# Patient Record
Sex: Male | Born: 1975 | Race: White | Hispanic: No | Marital: Single | Smoking: Current every day smoker
Health system: Southern US, Community
[De-identification: ages and names within clinical notes are randomized; demographics above are authoritative.]

---

## 2009-03-11 ENCOUNTER — Other Ambulatory Visit: Admission: RE | Admit: 2009-03-11 | Discharge: 2009-03-11 | Payer: Self-pay | Admitting: Urology

## 2013-08-19 ENCOUNTER — Encounter: Payer: Self-pay | Admitting: Physician Assistant

## 2013-08-19 ENCOUNTER — Ambulatory Visit (INDEPENDENT_AMBULATORY_CARE_PROVIDER_SITE_OTHER): Payer: BC Managed Care – PPO | Admitting: Physician Assistant

## 2013-08-19 VITALS — BP 118/80 | HR 74 | Temp 98.3°F | Ht 74.5 in | Wt 183.0 lb

## 2013-08-19 DIAGNOSIS — Z23 Encounter for immunization: Secondary | ICD-10-CM

## 2013-08-19 DIAGNOSIS — Z0000 Encounter for general adult medical examination without abnormal findings: Secondary | ICD-10-CM

## 2013-08-19 LAB — POCT URINALYSIS DIPSTICK
Bilirubin, UA: NEGATIVE
Blood, UA: NEGATIVE
Glucose, UA: NEGATIVE
Ketones, UA: NEGATIVE
LEUKOCYTES UA: NEGATIVE
NITRITE UA: NEGATIVE
PH UA: 5
PROTEIN UA: NEGATIVE
Spec Grav, UA: 1.01
UROBILINOGEN UA: NEGATIVE

## 2013-08-19 LAB — POCT UA - MICROSCOPIC ONLY
Bacteria, U Microscopic: NEGATIVE
CASTS, UR, LPF, POC: NEGATIVE
CRYSTALS, UR, HPF, POC: NEGATIVE
EPITHELIAL CELLS, URINE PER MICROSCOPY: NEGATIVE
Mucus, UA: NEGATIVE
RBC, URINE, MICROSCOPIC: NEGATIVE
WBC, Ur, HPF, POC: NEGATIVE
YEAST UA: NEGATIVE

## 2013-08-19 NOTE — Progress Notes (Signed)
Subjective:     Patient ID: Daleen SquibbKevin Lares, male   DOB: February 09, 1976, 38 y.o.   MRN: [id number]  HPI Pt needing PE for his ins at work Only complaint is some sl discharge when he has an erection  Review of Systems  HENT: Negative.   Eyes: Negative.   Respiratory: Negative.   Cardiovascular: Negative.   Gastrointestinal: Negative.   Endocrine: Negative.   Genitourinary: Positive for discharge. Negative for dysuria, urgency, frequency, hematuria, flank pain, decreased urine volume, penile swelling, scrotal swelling, enuresis, difficulty urinating, genital sores, penile pain and testicular pain.  Musculoskeletal: Negative.   Skin: Negative.   Allergic/Immunologic: Negative.   Neurological: Positive for weakness.  Psychiatric/Behavioral: Negative.        Objective:   Physical Exam  Nursing note and vitals reviewed. Constitutional: He is oriented to person, place, and time. He appears well-developed and well-nourished.  HENT:  Head: Normocephalic and atraumatic.  Right Ear: External ear normal.  Left Ear: External ear normal.  Nose: Nose normal.  Mouth/Throat: Oropharynx is clear and moist.  Eyes: Conjunctivae and EOM are normal. Pupils are equal, round, and reactive to light.  Neck: Normal range of motion. Neck supple. No JVD present. No tracheal deviation present. No thyromegaly present.  Cardiovascular: Normal rate, regular rhythm, normal heart sounds and intact distal pulses.   Pulmonary/Chest: Effort normal and breath sounds normal. He has no wheezes.  Abdominal: Soft. Bowel sounds are normal. He exhibits no distension and no mass. There is no tenderness. There is no rebound and no guarding.  Genitourinary: Penis normal. No penile tenderness.  No ing nodes Testes down No hernia  Musculoskeletal: Normal range of motion. He exhibits no edema.  Lymphadenopathy:    He has no cervical adenopathy.  Neurological: He is alert and oriented to person, place, and time. He has normal  reflexes.  Skin: Skin is warm and dry. No rash noted. No erythema. No pallor.  Psychiatric: He has a normal mood and affect. His behavior is normal. Judgment and thought content normal.       Assessment:     Physical Exam    Plan:     Pt needing dental and exam Full labs done today- will inform of lab results Cont with his water intake Hgt/wgt approp Cont with regular exercise Diet needs to improve so reviewed this with him today Smoking cessation reviewed F/U pending labs

## 2013-08-19 NOTE — Patient Instructions (Signed)
Fat and Cholesterol Control Diet  Fat and cholesterol levels in your blood and organs are influenced by your diet. High levels of fat and cholesterol may lead to diseases of the heart, small and large blood vessels, gallbladder, liver, and pancreas.  CONTROLLING FAT AND CHOLESTEROL WITH DIET  Although exercise and lifestyle factors are important, your diet is key. That is because certain foods are known to raise cholesterol and others to lower it. The goal is to balance foods for their effect on cholesterol and more importantly, to replace saturated and trans fat with other types of fat, such as monounsaturated fat, polyunsaturated fat, and omega-3 fatty acids.  On average, a person should consume no more than 15 to 17 g of saturated fat daily. Saturated and trans fats are considered "bad" fats, and they will raise LDL cholesterol. Saturated fats are primarily found in animal products such as meats, butter, and cream. However, that does not mean you need to give up all your favorite foods. Today, there are good tasting, low-fat, low-cholesterol substitutes for most of the things you like to eat. Choose low-fat or nonfat alternatives. Choose round or loin cuts of red meat. These types of cuts are lowest in fat and cholesterol. Chicken (without the skin), fish, veal, and ground turkey breast are great choices. Eliminate fatty meats, such as hot dogs and salami. Even shellfish have little or no saturated fat. Have a 3 oz (85 g) portion when you eat lean meat, poultry, or fish.  Trans fats are also called "partially hydrogenated oils." They are oils that have been scientifically manipulated so that they are solid at room temperature resulting in a longer shelf life and improved taste and texture of foods in which they are added. Trans fats are found in stick margarine, some tub margarines, cookies, crackers, and baked goods.   When baking and cooking, oils are a great substitute for butter. The monounsaturated oils are  especially beneficial since it is believed they lower LDL and raise HDL. The oils you should avoid entirely are saturated tropical oils, such as coconut and palm.   Remember to eat a lot from food groups that are naturally free of saturated and trans fat, including fish, fruit, vegetables, beans, grains (barley, rice, couscous, bulgur wheat), and pasta (without cream sauces).   IDENTIFYING FOODS THAT LOWER FAT AND CHOLESTEROL   Soluble fiber may lower your cholesterol. This type of fiber is found in fruits such as apples, vegetables such as broccoli, potatoes, and carrots, legumes such as beans, peas, and lentils, and grains such as barley. Foods fortified with plant sterols (phytosterol) may also lower cholesterol. You should eat at least 2 g per day of these foods for a cholesterol lowering effect.   Read package labels to identify low-saturated fats, trans fat free, and low-fat foods at the supermarket. Select cheeses that have only 2 to 3 g saturated fat per ounce. Use a heart-healthy tub margarine that is free of trans fats or partially hydrogenated oil. When buying baked goods (cookies, crackers), avoid partially hydrogenated oils. Breads and muffins should be made from whole grains (whole-wheat or whole oat flour, instead of "flour" or "enriched flour"). Buy non-creamy canned soups with reduced salt and no added fats.   FOOD PREPARATION TECHNIQUES   Never deep-fry. If you must fry, either stir-fry, which uses very little fat, or use non-stick cooking sprays. When possible, broil, bake, or roast meats, and steam vegetables. Instead of putting butter or margarine on vegetables, use lemon   and herbs, applesauce, and cinnamon (for squash and sweet potatoes). Use nonfat yogurt, salsa, and low-fat dressings for salads.   LOW-SATURATED FAT / LOW-FAT FOOD SUBSTITUTES  Meats / Saturated Fat (g)  · Avoid: Steak, marbled (3 oz/85 g) / 11 g  · Choose: Steak, lean (3 oz/85 g) / 4 g  · Avoid: Hamburger (3 oz/85 g) / 7  g  · Choose: Hamburger, lean (3 oz/85 g) / 5 g  · Avoid: Ham (3 oz/85 g) / 6 g  · Choose: Ham, lean cut (3 oz/85 g) / 2.4 g  · Avoid: Chicken, with skin, dark meat (3 oz/85 g) / 4 g  · Choose: Chicken, skin removed, dark meat (3 oz/85 g) / 2 g  · Avoid: Chicken, with skin, light meat (3 oz/85 g) / 2.5 g  · Choose: Chicken, skin removed, light meat (3 oz/85 g) / 1 g  Dairy / Saturated Fat (g)  · Avoid: Whole milk (1 cup) / 5 g  · Choose: Low-fat milk, 2% (1 cup) / 3 g  · Choose: Low-fat milk, 1% (1 cup) / 1.5 g  · Choose: Skim milk (1 cup) / 0.3 g  · Avoid: Hard cheese (1 oz/28 g) / 6 g  · Choose: Skim milk cheese (1 oz/28 g) / 2 to 3 g  · Avoid: Cottage cheese, 4% fat (1 cup) / 6.5 g  · Choose: Low-fat cottage cheese, 1% fat (1 cup) / 1.5 g  · Avoid: Ice cream (1 cup) / 9 g  · Choose: Sherbet (1 cup) / 2.5 g  · Choose: Nonfat frozen yogurt (1 cup) / 0.3 g  · Choose: Frozen fruit bar / trace  · Avoid: Whipped cream (1 tbs) / 3.5 g  · Choose: Nondairy whipped topping (1 tbs) / 1 g  Condiments / Saturated Fat (g)  · Avoid: Mayonnaise (1 tbs) / 2 g  · Choose: Low-fat mayonnaise (1 tbs) / 1 g  · Avoid: Butter (1 tbs) / 7 g  · Choose: Extra light margarine (1 tbs) / 1 g  · Avoid: Coconut oil (1 tbs) / 11.8 g  · Choose: Olive oil (1 tbs) / 1.8 g  · Choose: Corn oil (1 tbs) / 1.7 g  · Choose: Safflower oil (1 tbs) / 1.2 g  · Choose: Sunflower oil (1 tbs) / 1.4 g  · Choose: Soybean oil (1 tbs) / 2.4 g  · Choose: Canola oil (1 tbs) / 1 g  Document Released: 02/13/2005 Document Revised: 06/10/2012 Document Reviewed: 08/04/2010  [employer]® Patient Information ©2015 [employer]. This information is not intended to replace advice given to you by your health care Elhadji Pecore. Make sure you discuss any questions you have with your health care Fahed Morten.

## 2013-08-20 LAB — CMP14+EGFR
ALBUMIN: 4.6 g/dL (ref 3.5–5.5)
ALT: 45 IU/L — ABNORMAL HIGH (ref 0–44)
AST: 34 IU/L (ref 0–40)
Albumin/Globulin Ratio: 1.9 (ref 1.1–2.5)
Alkaline Phosphatase: 72 IU/L (ref 39–117)
BILIRUBIN TOTAL: 0.3 mg/dL (ref 0.0–1.2)
BUN / CREAT RATIO: 11 (ref 8–19)
BUN: 12 mg/dL (ref 6–20)
CHLORIDE: 101 mmol/L (ref 97–108)
CO2: 21 mmol/L (ref 18–29)
CREATININE: 1.07 mg/dL (ref 0.76–1.27)
Calcium: 9.7 mg/dL (ref 8.7–10.2)
GFR, EST AFRICAN AMERICAN: 103 mL/min/{1.73_m2} (ref >59)
GFR, EST NON AFRICAN AMERICAN: 89 mL/min/{1.73_m2} (ref >59)
GLOBULIN, TOTAL: 2.4 g/dL (ref 1.5–4.5)
GLUCOSE: 94 mg/dL (ref 65–99)
Potassium: 4 mmol/L (ref 3.5–5.2)
Sodium: 139 mmol/L (ref 134–144)
TOTAL PROTEIN: 7 g/dL (ref 6.0–8.5)

## 2013-08-20 LAB — LIPID PANEL
CHOLESTEROL TOTAL: 154 mg/dL (ref 100–199)
Chol/HDL Ratio: 2.2 ratio units (ref 0.0–5.0)
HDL: 70 mg/dL (ref >39)
LDL Calculated: 71 mg/dL (ref 0–99)
Triglycerides: 66 mg/dL (ref 0–149)
VLDL CHOLESTEROL CAL: 13 mg/dL (ref 5–40)

## 2013-08-22 LAB — GC/CHLAMYDIA PROBE AMP
CHLAMYDIA, DNA PROBE: NEGATIVE
NEISSERIA GONORRHOEAE BY PCR: NEGATIVE

## 2013-08-25 ENCOUNTER — Telehealth: Payer: Self-pay | Admitting: Family Medicine

## 2013-08-25 NOTE — Telephone Encounter (Signed)
Message copied by Azalee CourseFULP, ASHLEY on Mon Aug 25, 2013 11:10 AM ------      Message from: Inis SizerWEBSTER, WILLIAM L      Created: Fri Aug 22, 2013  4:20 PM       Pls inform all labs ok ------

## 2013-08-26 NOTE — Telephone Encounter (Signed)
Pt aware labs ok 

## 2013-09-03 ENCOUNTER — Telehealth: Payer: Self-pay | Admitting: Physician Assistant

## 2013-09-03 NOTE — Telephone Encounter (Signed)
Patient aware.

## 2013-09-10 ENCOUNTER — Encounter: Payer: Self-pay | Admitting: Physician Assistant

## 2014-06-09 ENCOUNTER — Telehealth: Payer: Self-pay | Admitting: Family Medicine

## 2014-06-09 NOTE — Telephone Encounter (Signed)
Pt given appt with Lynwood Dawleyiffany Gann 4/21 at 8:30. Pt aware it hasn't been a full year since his last physical but says his work is requiring him to have the physical and not just have the form filled out.

## 2014-06-18 ENCOUNTER — Encounter: Payer: Self-pay | Admitting: Physician Assistant

## 2014-06-18 ENCOUNTER — Ambulatory Visit (INDEPENDENT_AMBULATORY_CARE_PROVIDER_SITE_OTHER): Payer: BLUE CROSS/BLUE SHIELD | Admitting: Physician Assistant

## 2014-06-18 VITALS — BP 130/87 | HR 72 | Temp 97.0°F | Ht 74.0 in | Wt 181.4 lb

## 2014-06-18 DIAGNOSIS — Z0000 Encounter for general adult medical examination without abnormal findings: Secondary | ICD-10-CM

## 2014-06-18 LAB — POCT CBC
Granulocyte percent: 50.5 %G (ref 37–80)
HEMATOCRIT: 51 % (ref 43.5–53.7)
HEMOGLOBIN: 17.2 g/dL (ref 14.1–18.1)
Lymph, poc: 5.4 — AB (ref 0.6–3.4)
MCH: 33.7 pg — AB (ref 27–31.2)
MCHC: 33.8 g/dL (ref 31.8–35.4)
MCV: 99.5 fL — AB (ref 80–97)
MPV: 7.8 fL (ref 0–99.8)
POC Granulocyte: 6.1 (ref 2–6.9)
POC LYMPH PERCENT: 44.8 %L (ref 10–50)
Platelet Count, POC: 307 10*3/uL (ref 142–424)
RBC: 5.12 M/uL (ref 4.69–6.13)
RDW, POC: 12.7 %
WBC: 12 10*3/uL — AB (ref 4.6–10.2)

## 2014-06-18 NOTE — Addendum Note (Signed)
Addended by: Fawn KirkHOLT, CATHY on: 06/18/2014 11:32 AM   Modules accepted: Kipp BroodSmartSet

## 2014-06-18 NOTE — Progress Notes (Signed)
   Subjective:    Patient ID: Brett Hudson, male    DOB: [birth year], 39 y.o.   MRN: [id number]  HPI 39 y/o male presents for physical exam for work. No complaints and asymptomatic today.     Review of Systems  All other systems reviewed and are negative.      Objective:   Physical Exam  Constitutional: He is oriented to person, place, and time. He appears well-developed and well-nourished. No distress.  HENT:  Head: Normocephalic and atraumatic.  Right Ear: External ear normal.  Left Ear: External ear normal.  Mouth/Throat: Oropharynx is clear and moist. No oropharyngeal exudate.  Eyes: Conjunctivae and EOM are normal. Pupils are equal, round, and reactive to light. Right eye exhibits no discharge. Left eye exhibits no discharge.  Neck: Normal range of motion. No thyromegaly present.  Cardiovascular: Normal rate, regular rhythm and normal heart sounds.  Exam reveals no gallop and no friction rub.   No murmur heard. Pulmonary/Chest: Effort normal and breath sounds normal. No respiratory distress. He has no wheezes. He has no rales. He exhibits no tenderness.  Abdominal: Soft. Bowel sounds are normal. He exhibits no distension and no mass. There is no tenderness. There is no rebound and no guarding.  Musculoskeletal: Normal range of motion. He exhibits no edema.  Neurological: He is alert and oriented to person, place, and time. No cranial nerve deficit.  Skin: No rash noted. He is not diaphoretic. No erythema.  Psychiatric: He has a normal mood and affect. His behavior is normal. Judgment and thought content normal.  Vitals reviewed.         Assessment & Plan:  1. Wellness examination 1. Wellness examination  - POCT CBC - Lipid panel - CMP14+EGFR    RTOprn   Srijan Givan A. Wayland Salinas  Healthy male. Able to participate in work activities as required.    rtc prn   Yetunde Leis A. Benjamin Stain PA-C

## 2014-06-19 LAB — CMP14+EGFR
ALK PHOS: 81 IU/L (ref 39–117)
ALT: 33 IU/L (ref 0–44)
AST: 21 IU/L (ref 0–40)
Albumin/Globulin Ratio: 1.6 (ref 1.1–2.5)
Albumin: 4.4 g/dL (ref 3.5–5.5)
BILIRUBIN TOTAL: 0.3 mg/dL (ref 0.0–1.2)
BUN / CREAT RATIO: 9 (ref 8–19)
BUN: 9 mg/dL (ref 6–20)
CHLORIDE: 102 mmol/L (ref 97–108)
CO2: 25 mmol/L (ref 18–29)
Calcium: 9.8 mg/dL (ref 8.7–10.2)
Creatinine, Ser: 0.95 mg/dL (ref 0.76–1.27)
GFR calc non Af Amer: 103 mL/min/{1.73_m2} (ref >59)
GFR, EST AFRICAN AMERICAN: 119 mL/min/{1.73_m2} (ref >59)
Globulin, Total: 2.8 g/dL (ref 1.5–4.5)
Glucose: 102 mg/dL — ABNORMAL HIGH (ref 65–99)
POTASSIUM: 4.5 mmol/L (ref 3.5–5.2)
SODIUM: 142 mmol/L (ref 134–144)
Total Protein: 7.2 g/dL (ref 6.0–8.5)

## 2014-06-19 LAB — LIPID PANEL
CHOL/HDL RATIO: 2.5 ratio (ref 0.0–5.0)
CHOLESTEROL TOTAL: 148 mg/dL (ref 100–199)
HDL: 60 mg/dL (ref >39)
LDL Calculated: 68 mg/dL (ref 0–99)
TRIGLYCERIDES: 99 mg/dL (ref 0–149)
VLDL Cholesterol Cal: 20 mg/dL (ref 5–40)

## 2014-06-24 ENCOUNTER — Telehealth: Payer: Self-pay

## 2014-06-24 NOTE — Telephone Encounter (Signed)
-----   Message from Chelsea Primusiffany A Gann, PA-C sent at 06/19/2014  8:27 AM EDT ----- WNL. Tiffany A. Chauncey ReadingGann PA-C

## 2014-06-24 NOTE — Telephone Encounter (Signed)
[facility] ;all labs [facility] per Tiffany; [place]

## 2015-05-28 ENCOUNTER — Encounter: Payer: Self-pay | Admitting: Family Medicine

## 2015-05-28 ENCOUNTER — Ambulatory Visit (INDEPENDENT_AMBULATORY_CARE_PROVIDER_SITE_OTHER): Payer: BLUE CROSS/BLUE SHIELD | Admitting: Family Medicine

## 2015-05-28 VITALS — BP 107/66 | HR 72 | Temp 97.1°F | Ht 74.0 in | Wt 189.6 lb

## 2015-05-28 DIAGNOSIS — Z716 Tobacco abuse counseling: Secondary | ICD-10-CM

## 2015-05-28 DIAGNOSIS — Z0000 Encounter for general adult medical examination without abnormal findings: Secondary | ICD-10-CM | POA: Diagnosis not present

## 2015-05-28 DIAGNOSIS — IMO0001 Reserved for inherently not codable concepts without codable children: Secondary | ICD-10-CM

## 2015-05-28 DIAGNOSIS — F17209 Nicotine dependence, unspecified, with unspecified nicotine-induced disorders: Secondary | ICD-10-CM

## 2015-05-28 LAB — URINALYSIS
BILIRUBIN UA: NEGATIVE
GLUCOSE, UA: NEGATIVE
Ketones, UA: NEGATIVE
Leukocytes, UA: NEGATIVE
NITRITE UA: NEGATIVE
PH UA: 7 (ref 5.0–7.5)
Protein, UA: NEGATIVE
RBC, UA: NEGATIVE
Specific Gravity, UA: 1.015 (ref 1.005–1.030)
UUROB: 0.2 mg/dL (ref 0.2–1.0)

## 2015-05-28 MED ORDER — VARENICLINE TARTRATE 0.5 MG X 11 & 1 MG X 42 PO MISC: ORAL | Status: DC

## 2015-05-28 NOTE — Progress Notes (Signed)
Subjective:  Patient ID: Brett Hudson, male    DOB: Jul 27, 1975  Age: [age] y.o. MRN: [id number]  CC: Annual Exam   HPI Brett Hudson presents for CPE   History Brett Hudson has no past medical history on file.   Brett Hudson has no past surgical history on file.   His family history is not on file.Brett Hudson reports that Brett Hudson has been smoking Cigarettes.  Brett Hudson has been smoking about 1.00 pack per day. Brett Hudson does not have any smokeless tobacco history on file. Brett Hudson reports that Brett Hudson drinks about 6.0 oz of alcohol per week. Brett Hudson reports that Brett Hudson does not use illicit drugs.    ROS Review of Systems  Constitutional: Negative for fever, chills, diaphoresis and unexpected weight change.  HENT: Negative for congestion, hearing loss, rhinorrhea and sore throat.   Eyes: Negative for visual disturbance.  Respiratory: Negative for cough and shortness of breath.   Cardiovascular: Negative for chest pain.  Gastrointestinal: Negative for abdominal pain, diarrhea and constipation.  Genitourinary: Negative for dysuria and flank pain.  Musculoskeletal: Negative for joint swelling and arthralgias.  Skin: Negative for rash.  Neurological: Negative for dizziness and headaches.  Psychiatric/Behavioral: Negative for sleep disturbance and dysphoric mood.    Objective:  BP 107/66 mmHg  Pulse 72  Temp(Src) 97.1 F (36.2 C) (Oral)  Ht 6\' 2"  (1.88 m)  Wt 189 lb 9.6 oz (86.002 kg)  BMI 24.33 kg/m2  SpO2 100%  BP Readings from Last 3 Encounters:  05/28/15 107/66  06/18/14 130/87  08/19/13 118/80    Wt Readings from Last 3 Encounters:  05/28/15 189 lb 9.6 oz (86.002 kg)  06/18/14 181 lb 6.4 oz (82.283 kg)  08/19/13 183 lb (83.008 kg)     Physical Exam  Constitutional: Brett Hudson is oriented to person, place, and time. Brett Hudson appears well-developed and well-nourished. No distress.  HENT:  Head: Normocephalic and atraumatic.  Right Ear: External ear normal.  Left Ear: External ear normal.  Nose: Nose normal.  Mouth/Throat:  Oropharynx is clear and moist.  Eyes: Conjunctivae and EOM are normal. Pupils are equal, round, and reactive to light.  Neck: Normal range of motion. Neck supple. No tracheal deviation present. No thyromegaly present.  Cardiovascular: Normal rate, regular rhythm and normal heart sounds.  Exam reveals no gallop and no friction rub.   No murmur heard. Pulmonary/Chest: Effort normal and breath sounds normal. No respiratory distress. Brett Hudson has no wheezes. Brett Hudson has no rales.  Abdominal: Soft. Bowel sounds are normal. Brett Hudson exhibits no distension and no mass. There is no tenderness.  Musculoskeletal: Normal range of motion. Brett Hudson exhibits no edema.  Lymphadenopathy:    Brett Hudson has no cervical adenopathy.  Neurological: Brett Hudson is alert and oriented to person, place, and time. Brett Hudson has normal reflexes.  Skin: Skin is warm and dry.  Psychiatric: Brett Hudson has a normal mood and affect. His behavior is normal. Judgment and thought content normal.     Lab Results  Component Value Date   WBC 12.0* 06/18/2014   HGB 17.2 06/18/2014   HCT 51.0 06/18/2014   GLUCOSE 102* 06/18/2014   CHOL 148 06/18/2014   TRIG 99 06/18/2014   HDL 60 06/18/2014   LDLCALC 68 06/18/2014   ALT 33 06/18/2014   AST 21 06/18/2014   NA 142 06/18/2014   K 4.5 06/18/2014   CL 102 06/18/2014   CREATININE 0.95 06/18/2014   BUN 9 06/18/2014   CO2 25 06/18/2014    No results found.  Assessment & Plan:  Brett Hudson was seen today for annual exam.  Diagnoses and all orders for this visit:  Well adult -     CBC with Differential -     Comprehensive metabolic panel -     Lipid panel -     Hepatitis C antibody screen -     HIV antibody -     Urinalysis  Tobacco use disorder, continuous -     CBC with Differential -     Comprehensive metabolic panel -     Lipid panel -     Hepatitis C antibody screen -     HIV antibody  Tobacco abuse counseling  Other orders -     varenicline (CHANTIX STARTING MONTH PAK) 0.5 MG X 11 & 1 MG X 42 tablet; Take  one 0.5 mg tablet by mouth once daily for 3 days, then increase to one 0.5 mg tablet twice daily for 4 days, then increase to one 1 mg tablet twice daily.     I am having Brett Hudson start on varenicline.  Meds ordered this encounter  Medications  . varenicline (CHANTIX STARTING MONTH PAK) 0.5 MG X 11 & 1 MG X 42 tablet    Sig: Take one 0.5 mg tablet by mouth once daily for 3 days, then increase to one 0.5 mg tablet twice daily for 4 days, then increase to one 1 mg tablet twice daily.    Dispense:  53 tablet    Refill:  0     Follow-up: Return in 1 year (on 05/27/2016).  Mechele Claude, M.D.

## 2015-05-30 LAB — COMPREHENSIVE METABOLIC PANEL
ALBUMIN: 4.6 g/dL (ref 3.5–5.5)
ALK PHOS: 73 IU/L (ref 39–117)
ALT: 36 IU/L (ref 0–44)
AST: 24 IU/L (ref 0–40)
Albumin/Globulin Ratio: 1.7 (ref 1.2–2.2)
BUN/Creatinine Ratio: 10 (ref 8–19)
BUN: 9 mg/dL (ref 6–20)
Bilirubin Total: <0.2 mg/dL (ref 0.0–1.2)
CALCIUM: 9.8 mg/dL (ref 8.7–10.2)
CO2: 23 mmol/L (ref 18–29)
CREATININE: 0.94 mg/dL (ref 0.76–1.27)
Chloride: 96 mmol/L (ref 96–106)
GFR calc Af Amer: 119 mL/min/{1.73_m2} (ref >59)
GFR, EST NON AFRICAN AMERICAN: 103 mL/min/{1.73_m2} (ref >59)
GLOBULIN, TOTAL: 2.7 g/dL (ref 1.5–4.5)
GLUCOSE: 94 mg/dL (ref 65–99)
Potassium: 4.3 mmol/L (ref 3.5–5.2)
SODIUM: 139 mmol/L (ref 134–144)
Total Protein: 7.3 g/dL (ref 6.0–8.5)

## 2015-05-30 LAB — CBC WITH DIFFERENTIAL/PLATELET
Basophils Absolute: 0 10*3/uL (ref 0.0–0.2)
Basos: 0 %
EOS (ABSOLUTE): 0.3 10*3/uL (ref 0.0–0.4)
Eos: 3 %
HEMATOCRIT: 48.5 % (ref 37.5–51.0)
Hemoglobin: 17.1 g/dL (ref 12.6–17.7)
IMMATURE GRANS (ABS): 0 10*3/uL (ref 0.0–0.1)
IMMATURE GRANULOCYTES: 0 %
LYMPHS: 46 %
Lymphocytes Absolute: 3.9 10*3/uL — ABNORMAL HIGH (ref 0.7–3.1)
MCH: 34.9 pg — AB (ref 26.6–33.0)
MCHC: 35.3 g/dL (ref 31.5–35.7)
MCV: 99 fL — AB (ref 79–97)
MONOCYTES: 7 %
MONOS ABS: 0.6 10*3/uL (ref 0.1–0.9)
NEUTROS ABS: 3.8 10*3/uL (ref 1.4–7.0)
NEUTROS PCT: 44 %
Platelets: 363 10*3/uL (ref 150–379)
RBC: 4.9 x10E6/uL (ref 4.14–5.80)
RDW: 12.6 % (ref 12.3–15.4)
WBC: 8.6 10*3/uL (ref 3.4–10.8)

## 2015-05-30 LAB — HEPATITIS C ANTIBODY: Hep C Virus Ab: <0.1 s/co ratio (ref 0.0–0.9)

## 2015-05-30 LAB — LIPID PANEL
CHOL/HDL RATIO: 2.4 ratio (ref 0.0–5.0)
CHOLESTEROL TOTAL: 138 mg/dL (ref 100–199)
HDL: 58 mg/dL (ref >39)
LDL CALC: 67 mg/dL (ref 0–99)
TRIGLYCERIDES: 63 mg/dL (ref 0–149)
VLDL CHOLESTEROL CAL: 13 mg/dL (ref 5–40)

## 2015-05-30 LAB — HIV ANTIBODY (ROUTINE TESTING W REFLEX): HIV Screen 4th Generation wRfx: NONREACTIVE

## 2015-06-07 ENCOUNTER — Telehealth: Payer: Self-pay | Admitting: Family Medicine

## 2015-06-07 NOTE — Telephone Encounter (Signed)
Aware of all lab results. 

## 2016-06-08 ENCOUNTER — Ambulatory Visit (INDEPENDENT_AMBULATORY_CARE_PROVIDER_SITE_OTHER): Payer: BLUE CROSS/BLUE SHIELD | Admitting: Family Medicine

## 2016-06-08 ENCOUNTER — Encounter: Payer: Self-pay | Admitting: Family Medicine

## 2016-06-08 VITALS — BP 127/83 | HR 68 | Temp 98.4°F | Ht 74.0 in | Wt 202.0 lb

## 2016-06-08 DIAGNOSIS — Z0000 Encounter for general adult medical examination without abnormal findings: Secondary | ICD-10-CM

## 2016-06-08 DIAGNOSIS — Z131 Encounter for screening for diabetes mellitus: Secondary | ICD-10-CM

## 2016-06-08 DIAGNOSIS — Z13220 Encounter for screening for lipoid disorders: Secondary | ICD-10-CM

## 2016-06-08 NOTE — Progress Notes (Signed)
BP 127/83   Pulse 68   Temp 98.4 F (36.9 C) (Oral)   Ht '6\' 2"'$  (1.88 m)   Wt 202 lb (91.6 kg)   BMI 25.94 kg/m    Subjective:    Patient ID: Brett Hudson, male    DOB: [birth year], 41 y.o.   MRN: [id number]  HPI: Brett Hudson is a 41 y.o. male presenting on 06/08/2016 for Annual Exam (patient is fasting)   HPI Well adult exam Patient is coming today for a well-built exam fasting lab. He works for [employer] and needs physical evaluation for work and his insurance. He denies any major health issues. He is currently still smoking but he says about 1-2 cigarettes per day which is down from over a pack a day before. He did not try the Chantix because he is afraid of the side effects but has been doing it on his own. Patient denies any chest pain, shortness of breath, headaches or vision issues, abdominal complaints, diarrhea, nausea, vomiting, or joint issues.   Relevant past medical, surgical, family and social history reviewed and updated as indicated. Interim medical history since our last visit reviewed. Allergies and medications reviewed and updated.  Review of Systems  Constitutional: Negative for chills and fever.  HENT: Negative for ear pain and tinnitus.   Eyes: Negative for pain.  Respiratory: Negative for cough, shortness of breath and wheezing.   Cardiovascular: Negative for chest pain, palpitations and leg swelling.  Gastrointestinal: Negative for abdominal pain, blood in stool, constipation and diarrhea.  Genitourinary: Negative for dysuria and hematuria.  Musculoskeletal: Negative for back pain and myalgias.  Skin: Negative for rash.  Neurological: Negative for dizziness, weakness and headaches.  Psychiatric/Behavioral: Negative for suicidal ideas.    Per HPI unless specifically indicated above   Allergies as of 06/08/2016   No Known Allergies     Medication List       Accurate as of 06/08/16 11:44 AM. Always use your most recent med list.            varenicline 0.5 MG X 11 & 1 MG X 42 tablet Commonly known as:  CHANTIX STARTING MONTH PAK Take one 0.5 mg tablet by mouth once daily for 3 days, then increase to one 0.5 mg tablet twice daily for 4 days, then increase to one 1 mg tablet twice daily.          Objective:    BP 127/83   Pulse 68   Temp 98.4 F (36.9 C) (Oral)   Ht '6\' 2"'$  (1.88 m)   Wt 202 lb (91.6 kg)   BMI 25.94 kg/m   Wt Readings from Last 3 Encounters:  06/08/16 202 lb (91.6 kg)  05/28/15 189 lb 9.6 oz (86 kg)  06/18/14 181 lb 6.4 oz (82.3 kg)    Physical Exam  Constitutional: He is oriented to person, place, and time. He appears well-developed and well-nourished. No distress.  HENT:  Right Ear: External ear normal.  Left Ear: External ear normal.  Nose: Nose normal.  Mouth/Throat: Oropharynx is clear and moist. No oropharyngeal exudate.  Eyes: Conjunctivae and EOM are normal. Pupils are equal, round, and reactive to light. Right eye exhibits no discharge. No scleral icterus.  Neck: Neck supple. No thyromegaly present.  Cardiovascular: Normal rate, regular rhythm, normal heart sounds and intact distal pulses.   No murmur heard. Pulmonary/Chest: Effort normal and breath sounds normal. No respiratory distress. He has no wheezes.  Abdominal: Soft. Bowel sounds are normal. He  exhibits no distension. There is no tenderness. There is no rebound and no guarding.  Musculoskeletal: Normal range of motion. He exhibits no edema.  Lymphadenopathy:    He has no cervical adenopathy.  Neurological: He is alert and oriented to person, place, and time. Coordination normal.  Skin: Skin is warm and dry. No rash noted. He is not diaphoretic.  Psychiatric: He has a normal mood and affect. His behavior is normal.  Vitals reviewed.     Assessment & Plan:   Problem List Items Addressed This Visit    None    Visit Diagnoses    Well adult exam    -  Primary   Lipid screening       Relevant Orders   Lipid panel    Screening for diabetes mellitus       Relevant Orders   CMP14+EGFR     Follow up plan: Return in about 1 year (around 06/08/2017), or if symptoms worsen or fail to improve.  Counseling provided for all of the vaccine components Orders Placed This Encounter  Procedures  . CMP14+EGFR  . Lipid panel    Caryl Pina, MD [facility] 06/08/2016, 11:44 AM

## 2016-06-09 LAB — LIPID PANEL
Chol/HDL Ratio: 2.9 ratio (ref 0.0–5.0)
Cholesterol, Total: 164 mg/dL (ref 100–199)
HDL: 56 mg/dL (ref >39)
LDL Calculated: 89 mg/dL (ref 0–99)
TRIGLYCERIDES: 93 mg/dL (ref 0–149)
VLDL CHOLESTEROL CAL: 19 mg/dL (ref 5–40)

## 2016-06-09 LAB — CMP14+EGFR
A/G RATIO: 1.6 (ref 1.2–2.2)
ALK PHOS: 74 IU/L (ref 39–117)
ALT: 48 IU/L — AB (ref 0–44)
AST: 27 IU/L (ref 0–40)
Albumin: 4.5 g/dL (ref 3.5–5.5)
BILIRUBIN TOTAL: 0.4 mg/dL (ref 0.0–1.2)
BUN/Creatinine Ratio: 14 (ref 9–20)
BUN: 13 mg/dL (ref 6–20)
CALCIUM: 9.9 mg/dL (ref 8.7–10.2)
CHLORIDE: 100 mmol/L (ref 96–106)
CO2: 27 mmol/L (ref 18–29)
Creatinine, Ser: 0.95 mg/dL (ref 0.76–1.27)
GFR calc Af Amer: 117 mL/min/{1.73_m2} (ref >59)
GFR calc non Af Amer: 101 mL/min/{1.73_m2} (ref >59)
GLOBULIN, TOTAL: 2.8 g/dL (ref 1.5–4.5)
Glucose: 98 mg/dL (ref 65–99)
Potassium: 4.7 mmol/L (ref 3.5–5.2)
SODIUM: 140 mmol/L (ref 134–144)
Total Protein: 7.3 g/dL (ref 6.0–8.5)

## 2017-05-02 ENCOUNTER — Encounter: Payer: BLUE CROSS/BLUE SHIELD | Admitting: Family Medicine

## 2017-05-04 ENCOUNTER — Ambulatory Visit (INDEPENDENT_AMBULATORY_CARE_PROVIDER_SITE_OTHER): Payer: BLUE CROSS/BLUE SHIELD | Admitting: Family Medicine

## 2017-05-04 ENCOUNTER — Encounter: Payer: Self-pay | Admitting: Family Medicine

## 2017-05-04 VITALS — BP 129/68 | HR 71 | Temp 97.7°F | Ht 75.0 in | Wt 199.0 lb

## 2017-05-04 DIAGNOSIS — Z0000 Encounter for general adult medical examination without abnormal findings: Secondary | ICD-10-CM

## 2017-05-04 DIAGNOSIS — R748 Abnormal levels of other serum enzymes: Secondary | ICD-10-CM

## 2017-05-04 NOTE — Progress Notes (Signed)
BP 129/68 (BP Location: Left Arm)   Pulse 71   Temp 97.7 F (36.5 C) (Oral)   Ht '6\' 3"'$  (1.905 m)   Wt 199 lb (90.3 kg)   BMI 24.87 kg/m    Subjective:    Patient ID: Brett Hudson, male    DOB: November 25, 1975, 42 y.o.   MRN: [id number]  HPI: Brett Hudson is a 42 y.o. male presenting on 05/04/2017 for Annual Exam (for insurance purposes at work)   HPI Well adult exam and physical Patient is coming in today for well adult exam and physical and check for labs and exam for work.  He denies any major health issues or concerns.  He has recently quit smoking and has been doing well with that. Patient denies any chest pain, shortness of breath, headaches or vision issues, abdominal complaints, diarrhea, nausea, vomiting, or joint issues.   Relevant past medical, surgical, family and social history reviewed and updated as indicated. Interim medical history since our last visit reviewed. Allergies and medications reviewed and updated.  Review of Systems  Constitutional: Negative for chills and fever.  Eyes: Negative for discharge.  Respiratory: Negative for shortness of breath and wheezing.   Cardiovascular: Negative for chest pain and leg swelling.  Musculoskeletal: Negative for back pain and gait problem.  Skin: Negative for rash.  All other systems reviewed and are negative.   Per HPI unless specifically indicated above   Allergies as of 05/04/2017   No Known Allergies     Medication List    as of 05/04/2017 10:50 AM   You have not been prescribed any medications.        Objective:    BP 129/68 (BP Location: Left Arm)   Pulse 71   Temp 97.7 F (36.5 C) (Oral)   Ht '6\' 3"'$  (1.905 m)   Wt 199 lb (90.3 kg)   BMI 24.87 kg/m   Wt Readings from Last 3 Encounters:  05/04/17 199 lb (90.3 kg)  06/08/16 202 lb (91.6 kg)  05/28/15 189 lb 9.6 oz (86 kg)    Physical Exam  Constitutional: He is oriented to person, place, and time. He appears well-developed and well-nourished. No  distress.  HENT:  Right Ear: External ear normal.  Left Ear: External ear normal.  Nose: Nose normal.  Mouth/Throat: Oropharynx is clear and moist. No oropharyngeal exudate.  Eyes: Conjunctivae and EOM are normal. Pupils are equal, round, and reactive to light. No scleral icterus.  Neck: Neck supple. No thyromegaly present.  Cardiovascular: Normal rate, regular rhythm, normal heart sounds and intact distal pulses.  No murmur heard. Pulmonary/Chest: Effort normal and breath sounds normal. No respiratory distress. He has no wheezes.  Abdominal: Soft. Bowel sounds are normal. He exhibits no distension. There is no tenderness. There is no rebound and no guarding.  Musculoskeletal: Normal range of motion. He exhibits no edema.  Lymphadenopathy:    He has no cervical adenopathy.  Neurological: He is alert and oriented to person, place, and time. Coordination normal.  Skin: Skin is warm and dry. No rash noted. He is not diaphoretic.  Psychiatric: He has a normal mood and affect. His behavior is normal.  Vitals reviewed.       Assessment & Plan:   Problem List Items Addressed This Visit    None    Visit Diagnoses    Well adult exam    -  Primary   Relevant Orders   CBC with Differential/Platelet   CMP14+EGFR   Lipid  panel       Follow up plan: Return in about 1 year (around 05/05/2018), or if symptoms worsen or fail to improve.  Counseling provided for all of the vaccine components Orders Placed This Encounter  Procedures  . CBC with Differential/Platelet  . CMP14+EGFR  . Lipid panel    Caryl Pina, MD [facility] 05/04/2017, 10:50 AM

## 2017-05-05 LAB — CMP14+EGFR
A/G RATIO: 1.8 (ref 1.2–2.2)
ALT: 64 IU/L — ABNORMAL HIGH (ref 0–44)
AST: 27 IU/L (ref 0–40)
Albumin: 4.8 g/dL (ref 3.5–5.5)
Alkaline Phosphatase: 81 IU/L (ref 39–117)
BUN/Creatinine Ratio: 8 — ABNORMAL LOW (ref 9–20)
BUN: 8 mg/dL (ref 6–20)
Bilirubin Total: 0.4 mg/dL (ref 0.0–1.2)
CALCIUM: 9.9 mg/dL (ref 8.7–10.2)
CO2: 25 mmol/L (ref 20–29)
Chloride: 99 mmol/L (ref 96–106)
Creatinine, Ser: 1.01 mg/dL (ref 0.76–1.27)
GFR, EST AFRICAN AMERICAN: 108 mL/min/{1.73_m2} (ref >59)
GFR, EST NON AFRICAN AMERICAN: 93 mL/min/{1.73_m2} (ref >59)
GLOBULIN, TOTAL: 2.6 g/dL (ref 1.5–4.5)
Glucose: 94 mg/dL (ref 65–99)
POTASSIUM: 4.5 mmol/L (ref 3.5–5.2)
SODIUM: 138 mmol/L (ref 134–144)
TOTAL PROTEIN: 7.4 g/dL (ref 6.0–8.5)

## 2017-05-05 LAB — LIPID PANEL
CHOL/HDL RATIO: 2.7 ratio (ref 0.0–5.0)
Cholesterol, Total: 139 mg/dL (ref 100–199)
HDL: 52 mg/dL (ref >39)
LDL CALC: 75 mg/dL (ref 0–99)
Triglycerides: 61 mg/dL (ref 0–149)
VLDL Cholesterol Cal: 12 mg/dL (ref 5–40)

## 2017-05-05 LAB — CBC WITH DIFFERENTIAL/PLATELET
BASOS: 0 %
Basophils Absolute: 0 10*3/uL (ref 0.0–0.2)
EOS (ABSOLUTE): 0.4 10*3/uL (ref 0.0–0.4)
EOS: 4 %
HEMATOCRIT: 51 % (ref 37.5–51.0)
HEMOGLOBIN: 17.7 g/dL (ref 13.0–17.7)
IMMATURE GRANS (ABS): 0 10*3/uL (ref 0.0–0.1)
IMMATURE GRANULOCYTES: 0 %
Lymphocytes Absolute: 4.6 10*3/uL — ABNORMAL HIGH (ref 0.7–3.1)
Lymphs: 46 %
MCH: 35.5 pg — ABNORMAL HIGH (ref 26.6–33.0)
MCHC: 34.7 g/dL (ref 31.5–35.7)
MCV: 102 fL — AB (ref 79–97)
MONOS ABS: 0.7 10*3/uL (ref 0.1–0.9)
Monocytes: 7 %
NEUTROS PCT: 43 %
Neutrophils Absolute: 4.3 10*3/uL (ref 1.4–7.0)
Platelets: 341 10*3/uL (ref 150–379)
RBC: 4.99 x10E6/uL (ref 4.14–5.80)
RDW: 13.3 % (ref 12.3–15.4)
WBC: 10.1 10*3/uL (ref 3.4–10.8)

## 2017-05-09 NOTE — Addendum Note (Signed)
Addended by: Lorelee CoverOSTOSKY, Hill Mackie C on: 05/09/2017 03:00 PM   Modules accepted: Orders

## 2017-05-16 ENCOUNTER — Ambulatory Visit (HOSPITAL_COMMUNITY)
Admission: RE | Admit: 2017-05-16 | Discharge: 2017-05-16 | Disposition: A | Discharge status: Home / Self Care | Payer: BLUE CROSS/BLUE SHIELD | Source: Ambulatory Visit | Attending: Family Medicine | Admitting: Family Medicine

## 2017-05-16 DIAGNOSIS — R748 Abnormal levels of other serum enzymes: Secondary | ICD-10-CM | POA: Diagnosis not present

## 2018-05-21 ENCOUNTER — Encounter: Payer: BLUE CROSS/BLUE SHIELD | Admitting: Family Medicine

## 2020-02-19 IMAGING — US US ABDOMEN LIMITED
1 series · 14 of 25 positions shown · non-contrast
Comparison: None.

CLINICAL DATA: Elevated liver enzymes

EXAM:
ULTRASOUND ABDOMEN LIMITED RIGHT UPPER QUADRANT

[Series 1: us abdomen limited · 0.21mm/px · 14 of 44 slices shown]
[im 1/44]
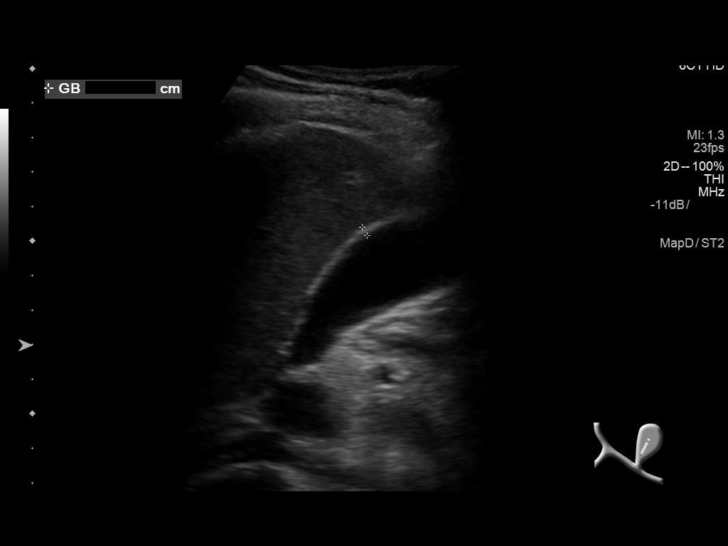
[im 4/44]
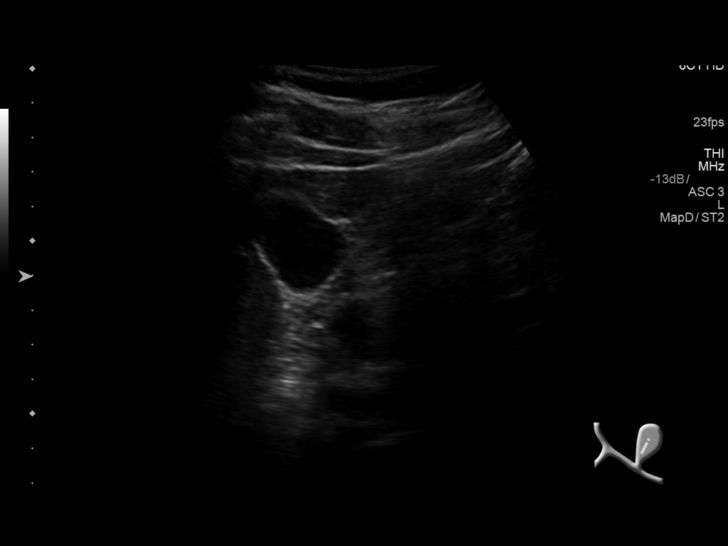
[im 8/44]
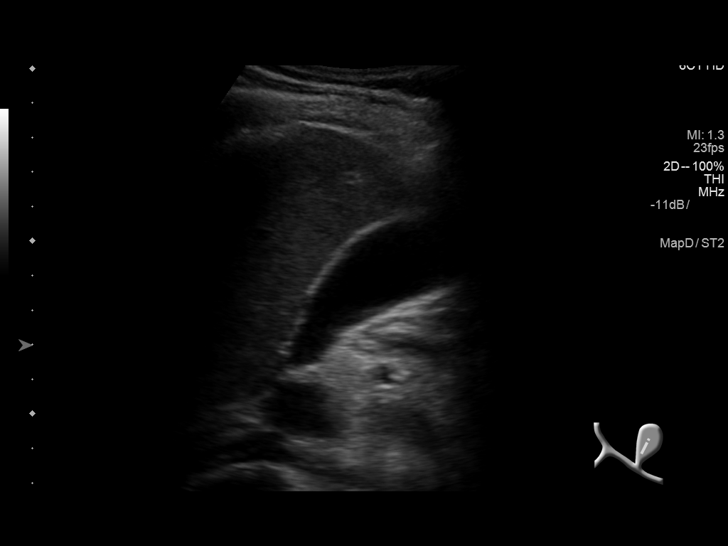
[im 11/44]
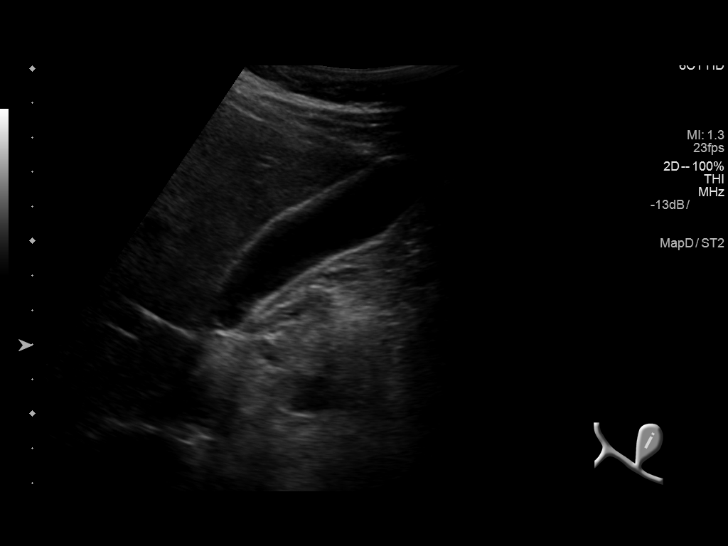
[im 15/44]
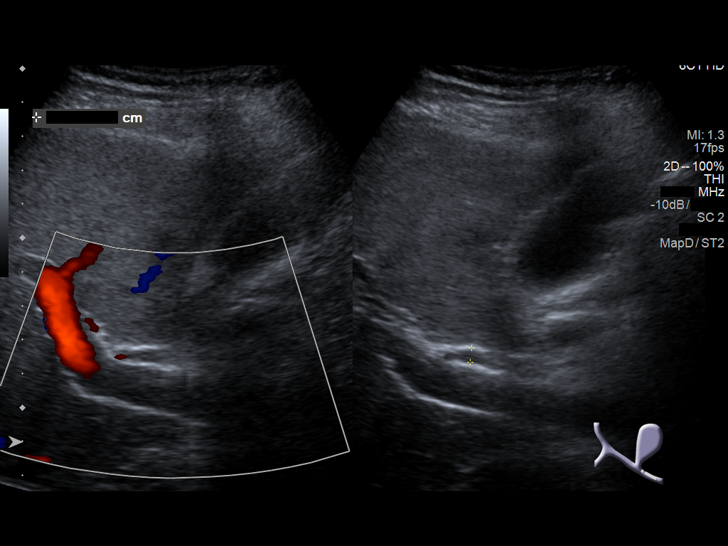
[im 17/44]
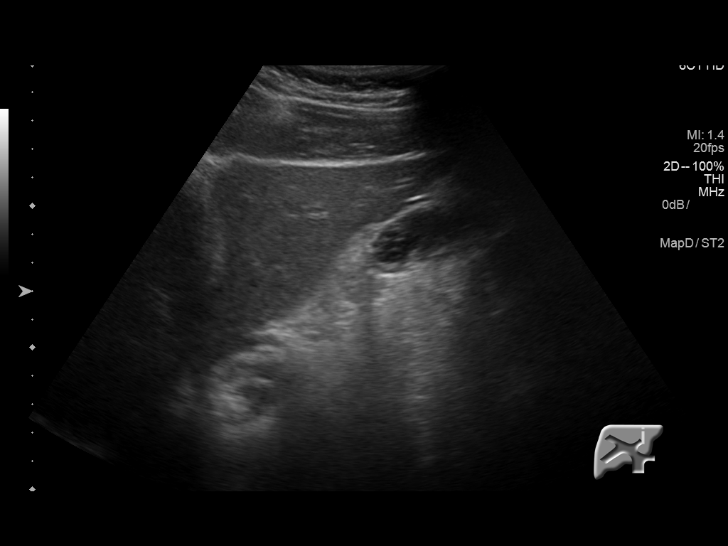
[im 20/44]
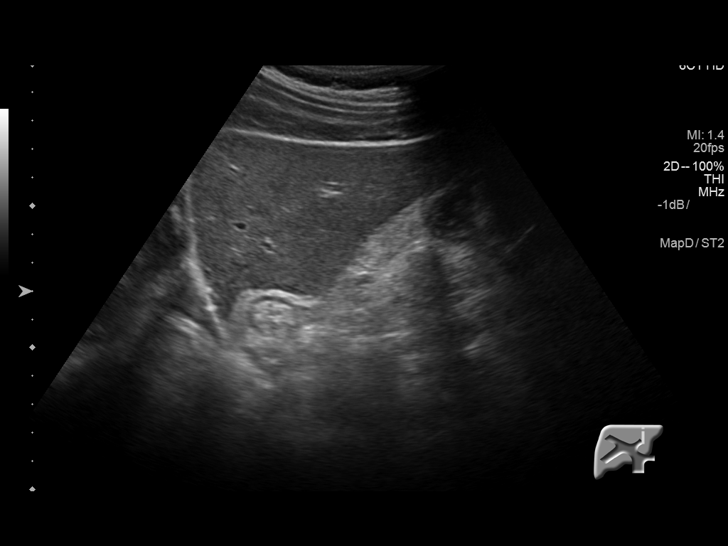
[im 24/44]
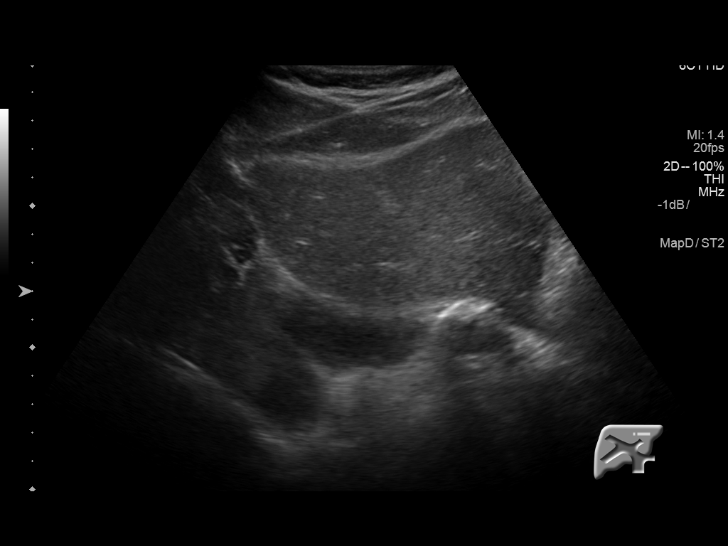
[im 27/44]
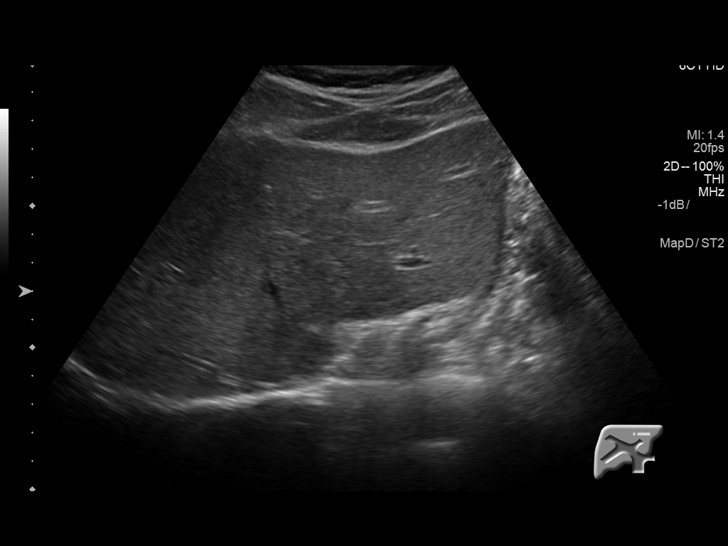
[im 29/44]
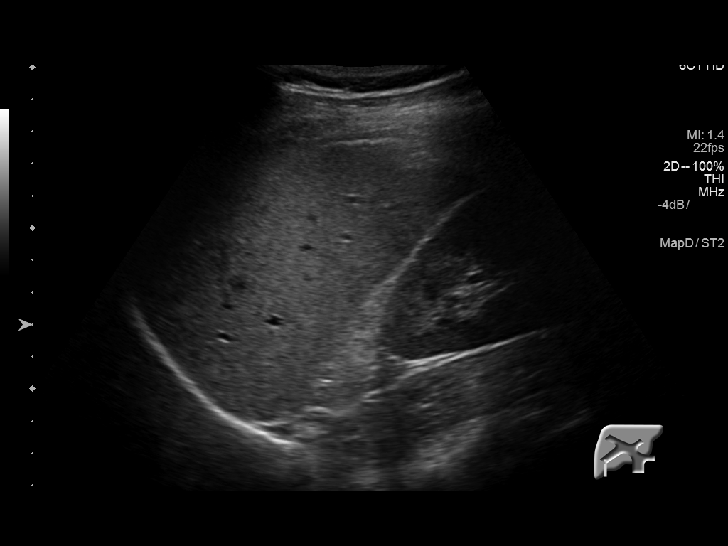
[im 33/44]
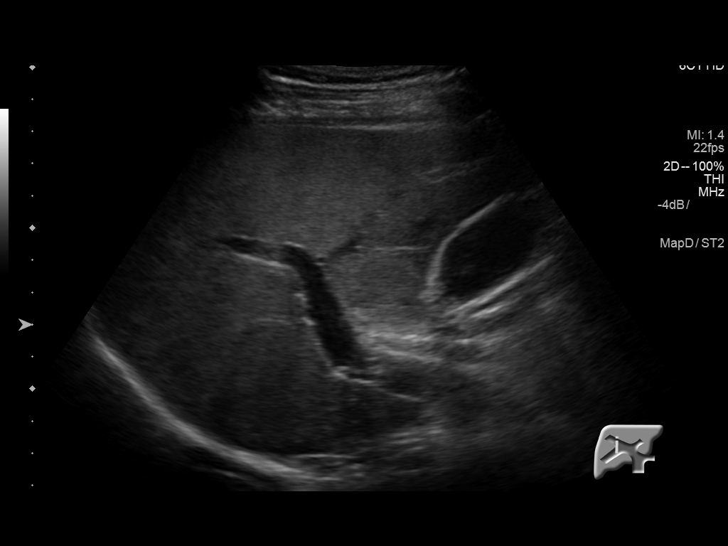
[im 36/44]
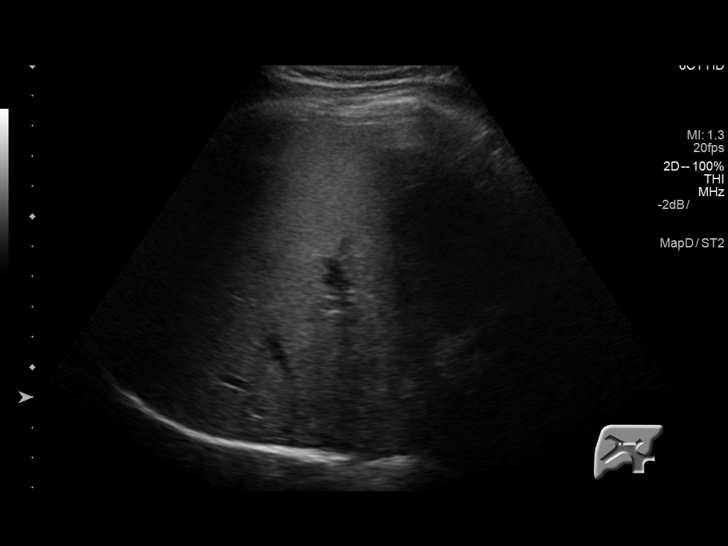
[im 40/44]
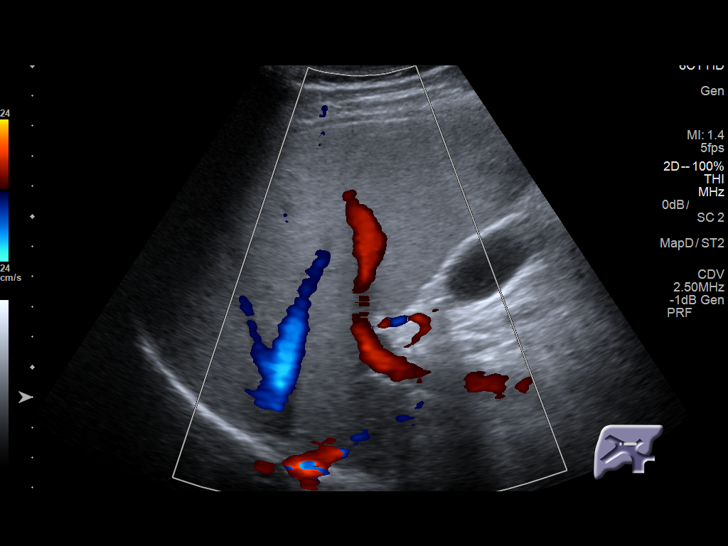
[im 44/44]
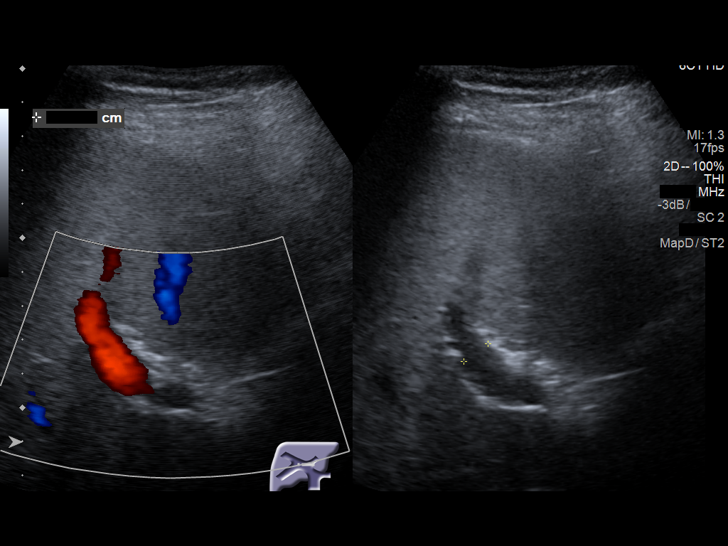

[14 of 25 positions shown; findings below may reference images not displayed]

FINDINGS: Gallbladder:

No gallstones or wall thickening visualized. There is no
pericholecystic fluid. No sonographic Murphy sign noted by
sonographer.

Common bile duct:

Diameter: 4 mm. No intrahepatic or extrahepatic biliary duct
dilatation.

Liver:

No focal lesion identified. Within normal limits in parenchymal
echogenicity. Portal vein is patent on color Doppler imaging with
normal direction of blood flow towards the liver.
IMPRESSION: Study within normal limits.

## 2020-06-01 ENCOUNTER — Other Ambulatory Visit: Payer: Self-pay

## 2020-06-01 ENCOUNTER — Encounter: Payer: Self-pay | Admitting: Family Medicine

## 2020-06-01 ENCOUNTER — Ambulatory Visit (INDEPENDENT_AMBULATORY_CARE_PROVIDER_SITE_OTHER): Payer: BC Managed Care – PPO | Admitting: Family Medicine

## 2020-06-01 VITALS — BP 130/84 | HR 75 | Temp 98.2°F | Ht 75.0 in | Wt 194.6 lb

## 2020-06-01 DIAGNOSIS — Z0000 Encounter for general adult medical examination without abnormal findings: Secondary | ICD-10-CM

## 2020-06-01 DIAGNOSIS — E559 Vitamin D deficiency, unspecified: Secondary | ICD-10-CM | POA: Diagnosis not present

## 2020-06-01 DIAGNOSIS — F17200 Nicotine dependence, unspecified, uncomplicated: Secondary | ICD-10-CM

## 2020-06-01 DIAGNOSIS — Z0001 Encounter for general adult medical examination with abnormal findings: Secondary | ICD-10-CM | POA: Diagnosis not present

## 2020-06-01 LAB — URINALYSIS
Bilirubin, UA: NEGATIVE
Glucose, UA: NEGATIVE
Ketones, UA: NEGATIVE
Leukocytes,UA: NEGATIVE
Nitrite, UA: NEGATIVE
Protein,UA: NEGATIVE
RBC, UA: NEGATIVE
Specific Gravity, UA: 1.025 (ref 1.005–1.030)
Urobilinogen, Ur: 0.2 mg/dL (ref 0.2–1.0)
pH, UA: 6 (ref 5.0–7.5)

## 2020-06-01 NOTE — Progress Notes (Signed)
Subjective:  Patient ID: Brett Hudson, male    DOB: Jan 16, 1976  Age: 45 y.o. MRN: [id number]  CC: Annual Exam   HPI Cainan Trull presents for CPE  Depression screen [facility] 2/9 06/01/2020 06/01/2020 05/04/2017  Decreased Interest 0 0 0  Down, Depressed, Hopeless 0 0 0  PHQ - 2 Score 0 0 0    History Alize has no past medical history on file.   He has no past surgical history on file.   His family history includes Diabetes in his brother.He reports that he has been smoking cigarettes. He has been smoking about 0.25 packs per day. He has never used smokeless tobacco. He reports current alcohol use of about 10.0 standard drinks of alcohol per week. He reports that he does not use drugs.    ROS Review of Systems  Constitutional: Negative for activity change, fatigue and unexpected weight change.  HENT: Negative for congestion, ear pain, hearing loss, postnasal drip and trouble swallowing.   Eyes: Negative for pain and visual disturbance.  Respiratory: Negative for cough, chest tightness and shortness of breath.   Cardiovascular: Negative for chest pain, palpitations and leg swelling.  Gastrointestinal: Negative for abdominal distention, abdominal pain, blood in stool, constipation, diarrhea, nausea and vomiting.  Endocrine: Negative for cold intolerance, heat intolerance and polydipsia.  Genitourinary: Negative for difficulty urinating, dysuria, flank pain, frequency and urgency.  Musculoskeletal: Negative for arthralgias and joint swelling.  Skin: Negative for color change, rash and wound.  Neurological: Negative for dizziness, syncope, speech difficulty, weakness, light-headedness, numbness and headaches.  Hematological: Does not bruise/bleed easily.  Psychiatric/Behavioral: Negative for confusion, decreased concentration, dysphoric mood and sleep disturbance. The patient is not nervous/anxious.     Objective:  BP 130/84   Pulse 75   Temp 98.2 F (36.8 C)   Ht $R'6\' 3"'Zo$  (1.905 m)    Wt 194 lb 9.6 oz (88.3 kg)   SpO2 96%   BMI 24.32 kg/m   BP Readings from Last 3 Encounters:  06/01/20 130/84  05/04/17 129/68  06/08/16 127/83    Wt Readings from Last 3 Encounters:  06/01/20 194 lb 9.6 oz (88.3 kg)  05/04/17 199 lb (90.3 kg)  06/08/16 202 lb (91.6 kg)     Physical Exam Constitutional:      Appearance: He is well-developed.  HENT:     Head: Normocephalic and atraumatic.  Eyes:     Pupils: Pupils are equal, round, and reactive to light.  Neck:     Thyroid: No thyromegaly.     Trachea: No tracheal deviation.  Cardiovascular:     Rate and Rhythm: Normal rate and regular rhythm.     Heart sounds: Normal heart sounds. No murmur heard. No friction rub. No gallop.   Pulmonary:     Breath sounds: Normal breath sounds. No wheezing or rales.  Abdominal:     General: Bowel sounds are normal. There is no distension.     Palpations: Abdomen is soft. There is no mass.     Tenderness: There is no abdominal tenderness.     Hernia: There is no hernia in the left inguinal area.  Genitourinary:    Penis: Normal.      Testes: Normal.  Musculoskeletal:        General: Normal range of motion.     Cervical back: Normal range of motion.  Lymphadenopathy:     Cervical: No cervical adenopathy.  Skin:    General: Skin is warm and dry.  Neurological:  Mental Status: He is alert and oriented to person, place, and time.       Assessment & Plan:   Kaiyu was seen today for annual exam.  Diagnoses and all orders for this visit:  Well adult exam -     CBC with Differential/Platelet -     CMP14+EGFR -     Lipid panel -     Urinalysis -     VITAMIN D 25 Hydroxy (Vit-D Deficiency, Fractures)  Vitamin D deficiency -     VITAMIN D 25 Hydroxy (Vit-D Deficiency, Fractures)  Current every day smoker     Smoking cessation advised  [facility] Genera does not currently have medications on file.  Allergies as of 06/01/2020   No Known Allergies     Medication  List    as of June 01, 2020 12:08 PM   You have not been prescribed any medications.      Follow-up: Return in about 1 year (around 06/01/2021) for [facility] physical.  Claretta Fraise, M.D.

## 2020-06-02 LAB — CBC WITH DIFFERENTIAL/PLATELET
Basophils Absolute: 0 10*3/uL (ref 0.0–0.2)
Basos: 0 %
EOS (ABSOLUTE): 0.3 10*3/uL (ref 0.0–0.4)
Eos: 3 %
Hematocrit: 51.4 % — ABNORMAL HIGH (ref 37.5–51.0)
Hemoglobin: 17.7 g/dL (ref 13.0–17.7)
Immature Grans (Abs): 0.1 10*3/uL (ref 0.0–0.1)
Immature Granulocytes: 1 %
Lymphocytes Absolute: 4.8 10*3/uL — ABNORMAL HIGH (ref 0.7–3.1)
Lymphs: 41 %
MCH: 34.2 pg — ABNORMAL HIGH (ref 26.6–33.0)
MCHC: 34.4 g/dL (ref 31.5–35.7)
MCV: 99 fL — ABNORMAL HIGH (ref 79–97)
Monocytes Absolute: 0.8 10*3/uL (ref 0.1–0.9)
Monocytes: 7 %
Neutrophils Absolute: 5.6 10*3/uL (ref 1.4–7.0)
Neutrophils: 48 %
Platelets: 371 10*3/uL (ref 150–450)
RBC: 5.18 x10E6/uL (ref 4.14–5.80)
RDW: 12.4 % (ref 11.6–15.4)
WBC: 11.7 10*3/uL — ABNORMAL HIGH (ref 3.4–10.8)

## 2020-06-02 LAB — CMP14+EGFR
ALT: 25 IU/L (ref 0–44)
AST: 21 IU/L (ref 0–40)
Albumin/Globulin Ratio: 1.6 (ref 1.2–2.2)
Albumin: 4.5 g/dL (ref 4.0–5.0)
Alkaline Phosphatase: 102 IU/L (ref 44–121)
BUN/Creatinine Ratio: 10 (ref 9–20)
BUN: 10 mg/dL (ref 6–24)
Bilirubin Total: 0.3 mg/dL (ref 0.0–1.2)
CO2: 24 mmol/L (ref 20–29)
Calcium: 10 mg/dL (ref 8.7–10.2)
Chloride: 103 mmol/L (ref 96–106)
Creatinine, Ser: 0.97 mg/dL (ref 0.76–1.27)
Globulin, Total: 2.9 g/dL (ref 1.5–4.5)
Glucose: 95 mg/dL (ref 65–99)
Potassium: 5 mmol/L (ref 3.5–5.2)
Sodium: 141 mmol/L (ref 134–144)
Total Protein: 7.4 g/dL (ref 6.0–8.5)
eGFR: 100 mL/min/{1.73_m2} (ref >59)

## 2020-06-02 LAB — LIPID PANEL
Chol/HDL Ratio: 2.5 ratio (ref 0.0–5.0)
Cholesterol, Total: 150 mg/dL (ref 100–199)
HDL: 61 mg/dL (ref >39)
LDL Chol Calc (NIH): 76 mg/dL (ref 0–99)
Triglycerides: 63 mg/dL (ref 0–149)
VLDL Cholesterol Cal: 13 mg/dL (ref 5–40)

## 2020-06-02 LAB — VITAMIN D 25 HYDROXY (VIT D DEFICIENCY, FRACTURES): Vit D, 25-Hydroxy: 11.6 ng/mL — ABNORMAL LOW (ref 30.0–100.0)

## 2020-06-02 NOTE — Progress Notes (Signed)
Dear Brett Hudson, Your Vitamin D is  low. You need a prescription strength supplement I will send that in for you. Nurse, if at all possible, could you send in a prescription for the patient for vitamin D 50,000 units, 1 p.o. weekly #13 with 3 refills? Many thanks, WS

## 2020-06-03 ENCOUNTER — Telehealth: Payer: Self-pay

## 2020-06-03 ENCOUNTER — Other Ambulatory Visit: Payer: Self-pay | Admitting: *Deleted

## 2020-06-03 MED ORDER — VITAMIN D (ERGOCALCIFEROL) 1.25 MG (50000 UNIT) PO CAPS: 50000.0000 [IU] | ORAL_CAPSULE | ORAL | 3 refills | Status: DC

## 2020-06-03 NOTE — Telephone Encounter (Signed)
Patient aware medication has been sent

## 2021-03-01 ENCOUNTER — Encounter: Payer: BC Managed Care – PPO | Admitting: Family Medicine

## 2021-03-14 ENCOUNTER — Encounter: Payer: Self-pay | Admitting: Family Medicine

## 2021-03-14 ENCOUNTER — Ambulatory Visit (INDEPENDENT_AMBULATORY_CARE_PROVIDER_SITE_OTHER): Payer: BC Managed Care – PPO | Admitting: Family Medicine

## 2021-03-14 VITALS — BP 111/67 | HR 73 | Temp 98.4°F | Ht 75.0 in | Wt 189.2 lb

## 2021-03-14 DIAGNOSIS — Z0001 Encounter for general adult medical examination with abnormal findings: Secondary | ICD-10-CM

## 2021-03-14 DIAGNOSIS — E559 Vitamin D deficiency, unspecified: Secondary | ICD-10-CM | POA: Diagnosis not present

## 2021-03-14 DIAGNOSIS — Z0000 Encounter for general adult medical examination without abnormal findings: Secondary | ICD-10-CM

## 2021-03-14 LAB — URINALYSIS
Bilirubin, UA: NEGATIVE
Glucose, UA: NEGATIVE
Leukocytes,UA: NEGATIVE
Nitrite, UA: NEGATIVE
Protein,UA: NEGATIVE
RBC, UA: NEGATIVE
Specific Gravity, UA: 1.02 (ref 1.005–1.030)
Urobilinogen, Ur: 0.2 mg/dL (ref 0.2–1.0)
pH, UA: 6 (ref 5.0–7.5)

## 2021-03-14 NOTE — Progress Notes (Signed)
Subjective:  Patient ID: Brett Hudson, male    DOB: Sep 24, 1975  Age: 46 y.o. MRN: [id number]  CC: Annual Exam   HPI Laird Runnion presents for Annual exam.  Depression screen [facility] 2/9 03/14/2021 06/01/2020 06/01/2020  Decreased Interest 0 0 0  Down, Depressed, Hopeless 0 0 0  PHQ - 2 Score 0 0 0    History Corderius has no past medical history on file.   He has no past surgical history on file.   His family history includes Diabetes in his brother.He reports that he has been smoking cigarettes. He has been smoking an average of .25 packs per day. He has never used smokeless tobacco. He reports current alcohol use of about 10.0 standard drinks per week. He reports that he does not use drugs.    ROS Review of Systems  Constitutional:  Negative for activity change, fatigue, fever and unexpected weight change.  HENT:  Negative for congestion, ear pain, hearing loss, postnasal drip and trouble swallowing.   Eyes:  Negative for pain and visual disturbance.  Respiratory:  Negative for cough, chest tightness and shortness of breath.   Cardiovascular:  Negative for chest pain, palpitations and leg swelling.  Gastrointestinal:  Negative for abdominal distention, abdominal pain, blood in stool, constipation, diarrhea, nausea and vomiting.  Endocrine: Negative for cold intolerance, heat intolerance and polydipsia.  Genitourinary:  Negative for difficulty urinating, dysuria, flank pain, frequency and urgency.  Musculoskeletal:  Negative for arthralgias and joint swelling.  Skin:  Negative for color change, rash and wound.  Neurological:  Negative for dizziness, syncope, speech difficulty, weakness, light-headedness, numbness and headaches.  Hematological:  Does not bruise/bleed easily.  Psychiatric/Behavioral:  Negative for confusion, decreased concentration, dysphoric mood and sleep disturbance. The patient is not nervous/anxious.    Objective:  BP 111/67    Pulse 73    Temp 98.4 F (36.9 C)     Ht _0  (1.905 m)    Wt 189 lb 3.2 oz (85.8 kg)    SpO2 98%    BMI 23.65 kg/m   BP Readings from Last 3 Encounters:  03/14/21 111/67  06/01/20 130/84  05/04/17 129/68    Wt Readings from Last 3 Encounters:  03/14/21 189 lb 3.2 oz (85.8 kg)  06/01/20 194 lb 9.6 oz (88.3 kg)  05/04/17 199 lb (90.3 kg)     Physical Exam Constitutional:      Appearance: He is well-developed.  HENT:     Head: Normocephalic and atraumatic.  Eyes:     Pupils: Pupils are equal, round, and reactive to light.  Neck:     Thyroid: No thyromegaly.     Trachea: No tracheal deviation.  Cardiovascular:     Rate and Rhythm: Normal rate and regular rhythm.     Heart sounds: Normal heart sounds. No murmur heard.   No friction rub. No gallop.  Pulmonary:     Breath sounds: Normal breath sounds. No wheezing or rales.  Abdominal:     General: Bowel sounds are normal. There is no distension.     Palpations: Abdomen is soft. There is no mass.     Tenderness: There is no abdominal tenderness.     Hernia: There is no hernia in the left inguinal area.  Genitourinary:    Penis: Normal.      Testes: Normal.  Musculoskeletal:        General: Normal range of motion.     Cervical back: Normal range of motion.  Lymphadenopathy:  Cervical: No cervical adenopathy.  Skin:    General: Skin is warm and dry.  Neurological:     Mental Status: He is alert and oriented to person, place, and time.      Assessment & Plan:   Yarnell was seen today for annual exam.  Diagnoses and all orders for this visit:  Well adult exam -     CBC with Differential/Platelet -     CMP14+EGFR -     VITAMIN D 25 Hydroxy (Vit-D Deficiency, Fractures) -     Lipid panel -     Urinalysis  Vitamin D deficiency -     VITAMIN D 25 Hydroxy (Vit-D Deficiency, Fractures)       I am having Santa Genera maintain his Vitamin D (Ergocalciferol).  Allergies as of 03/14/2021   No Known Allergies      Medication List         Accurate as of March 14, 2021  9:46 AM. If you have any questions, ask your nurse or doctor.          Vitamin D (Ergocalciferol) 1.25 MG (50000 UNIT) Caps capsule Commonly known as: DRISDOL Take 1 capsule (50,000 Units total) by mouth every 7 (seven) days.         Follow-up: Return in about 1 year (around 03/14/2022).  Claretta Fraise, M.D.

## 2021-03-15 ENCOUNTER — Other Ambulatory Visit: Payer: Self-pay

## 2021-03-15 LAB — CBC WITH DIFFERENTIAL/PLATELET
Basophils Absolute: 0 10*3/uL (ref 0.0–0.2)
Basos: 0 %
EOS (ABSOLUTE): 0.3 10*3/uL (ref 0.0–0.4)
Eos: 3 %
Hematocrit: 50.4 % (ref 37.5–51.0)
Hemoglobin: 17.7 g/dL (ref 13.0–17.7)
Immature Grans (Abs): 0 10*3/uL (ref 0.0–0.1)
Immature Granulocytes: 0 %
Lymphocytes Absolute: 4 10*3/uL — ABNORMAL HIGH (ref 0.7–3.1)
Lymphs: 41 %
MCH: 34.2 pg — ABNORMAL HIGH (ref 26.6–33.0)
MCHC: 35.1 g/dL (ref 31.5–35.7)
MCV: 97 fL (ref 79–97)
Monocytes Absolute: 0.8 10*3/uL (ref 0.1–0.9)
Monocytes: 8 %
Neutrophils Absolute: 4.5 10*3/uL (ref 1.4–7.0)
Neutrophils: 48 %
Platelets: 329 10*3/uL (ref 150–450)
RBC: 5.18 x10E6/uL (ref 4.14–5.80)
RDW: 12.4 % (ref 11.6–15.4)
WBC: 9.7 10*3/uL (ref 3.4–10.8)

## 2021-03-15 LAB — CMP14+EGFR
ALT: 32 IU/L (ref 0–44)
AST: 21 IU/L (ref 0–40)
Albumin/Globulin Ratio: 1.8 (ref 1.2–2.2)
Albumin: 4.7 g/dL (ref 4.0–5.0)
Alkaline Phosphatase: 94 IU/L (ref 44–121)
BUN/Creatinine Ratio: 9 (ref 9–20)
BUN: 9 mg/dL (ref 6–24)
Bilirubin Total: 0.3 mg/dL (ref 0.0–1.2)
CO2: 25 mmol/L (ref 20–29)
Calcium: 9.9 mg/dL (ref 8.7–10.2)
Chloride: 100 mmol/L (ref 96–106)
Creatinine, Ser: 1.01 mg/dL (ref 0.76–1.27)
Globulin, Total: 2.6 g/dL (ref 1.5–4.5)
Glucose: 122 mg/dL — ABNORMAL HIGH (ref 70–99)
Potassium: 4.2 mmol/L (ref 3.5–5.2)
Sodium: 141 mmol/L (ref 134–144)
Total Protein: 7.3 g/dL (ref 6.0–8.5)
eGFR: 95 mL/min/{1.73_m2} (ref >59)

## 2021-03-15 LAB — LIPID PANEL
Chol/HDL Ratio: 2.9 ratio (ref 0.0–5.0)
Cholesterol, Total: 152 mg/dL (ref 100–199)
HDL: 52 mg/dL (ref >39)
LDL Chol Calc (NIH): 83 mg/dL (ref 0–99)
Triglycerides: 92 mg/dL (ref 0–149)
VLDL Cholesterol Cal: 17 mg/dL (ref 5–40)

## 2021-03-15 LAB — VITAMIN D 25 HYDROXY (VIT D DEFICIENCY, FRACTURES): Vit D, 25-Hydroxy: 15.4 ng/mL — ABNORMAL LOW (ref 30.0–100.0)

## 2021-03-15 MED ORDER — VITAMIN D (ERGOCALCIFEROL) 1.25 MG (50000 UNIT) PO CAPS: 50000.0000 [IU] | ORAL_CAPSULE | ORAL | 3 refills | Status: AC

## 2021-03-15 NOTE — Progress Notes (Signed)
Dear Peter Koffman, Your Vitamin D is  low. You need a prescription strength supplement I will send that in for you. Nurse, if at all possible, could you send in a prescription for the patient for vitamin D 50,000 units, 1 p.o. weekly #13 with 3 refills? Many thanks, WS

## 2022-03-06 ENCOUNTER — Telehealth: Payer: Self-pay | Admitting: *Deleted

## 2022-03-06 NOTE — Telephone Encounter (Signed)
Transition Care Management Unsuccessful Follow-up Telephone Call  Date of discharge and from where:  03/04/2022  Attempts:  1st Attempt  Reason for unsuccessful TCM follow-up call:  Missing or invalid number

## 2022-03-15 ENCOUNTER — Encounter: Payer: Self-pay | Admitting: Family Medicine

## 2022-03-15 ENCOUNTER — Encounter: Payer: BC Managed Care – PPO | Admitting: Family Medicine

## 2022-06-21 ENCOUNTER — Ambulatory Visit: Payer: BC Managed Care – PPO | Admitting: Family Medicine

## 2022-06-26 ENCOUNTER — Encounter: Payer: BC Managed Care – PPO | Admitting: Family Medicine

## 2022-06-28 ENCOUNTER — Encounter: Payer: BC Managed Care – PPO | Admitting: Family Medicine

## 2022-06-29 ENCOUNTER — Encounter: Payer: Self-pay | Admitting: Family Medicine
